# Patient Record
Sex: Male | Born: 1985 | Race: Black or African American | Hispanic: No | Marital: Single | Smoking: Current every day smoker
Health system: Southern US, Community
[De-identification: ages and names within clinical notes are randomized; demographics above are authoritative.]

## PROBLEM LIST (undated history)

## (undated) DIAGNOSIS — A549 Gonococcal infection, unspecified: Secondary | ICD-10-CM

---

## 2010-02-23 ENCOUNTER — Emergency Department (HOSPITAL_BASED_OUTPATIENT_CLINIC_OR_DEPARTMENT_OTHER): Admission: EM | Admit: 2010-02-23 | Discharge: 2010-02-23 | Payer: Self-pay | Admitting: Emergency Medicine

## 2010-02-23 ENCOUNTER — Emergency Department (HOSPITAL_BASED_OUTPATIENT_CLINIC_OR_DEPARTMENT_OTHER): Admission: EM | Admit: 2010-02-23 | Discharge: 2010-02-24 | Payer: Self-pay | Admitting: Emergency Medicine

## 2010-04-24 ENCOUNTER — Ambulatory Visit: Payer: Self-pay | Admitting: Diagnostic Radiology

## 2010-04-24 ENCOUNTER — Emergency Department (HOSPITAL_BASED_OUTPATIENT_CLINIC_OR_DEPARTMENT_OTHER): Admission: EM | Admit: 2010-04-24 | Discharge: 2010-04-25 | Payer: Self-pay | Admitting: Emergency Medicine

## 2011-10-01 IMAGING — CR DG FINGER MIDDLE 2+V*R*
3 series · 3 of 3 positions shown · non-contrast
Comparison: None.

CLINICAL DATA: Finger pain.  Blunt trauma.

RIGHT MIDDLE FINGER 2+V

[x finger pa right]
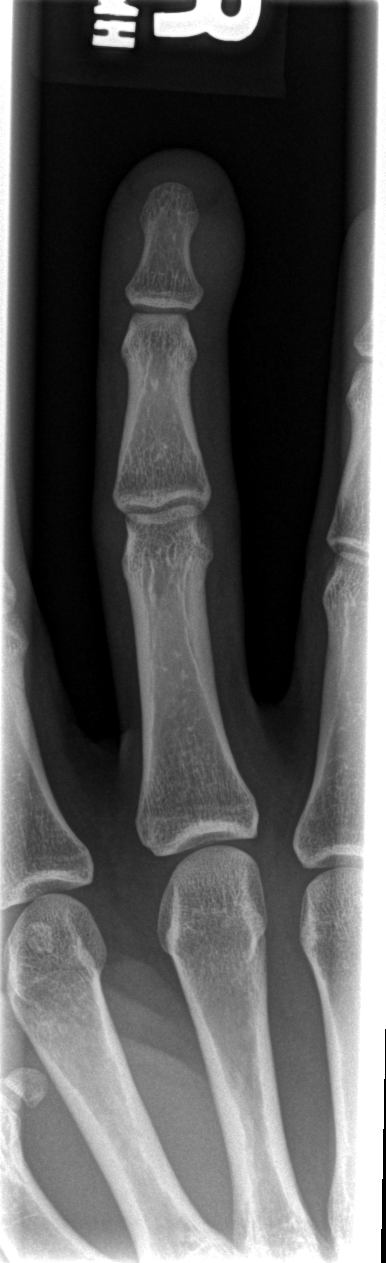

[x finger obl. right]
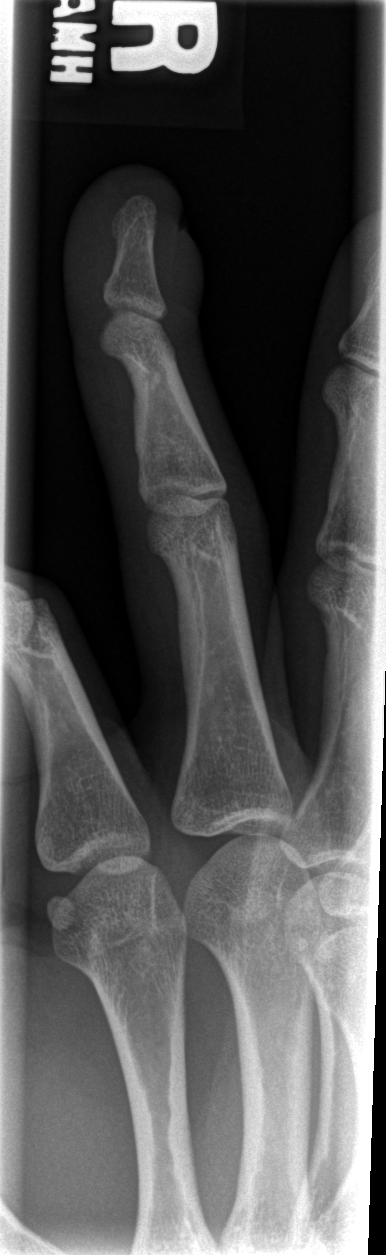

[x finger lateral right]
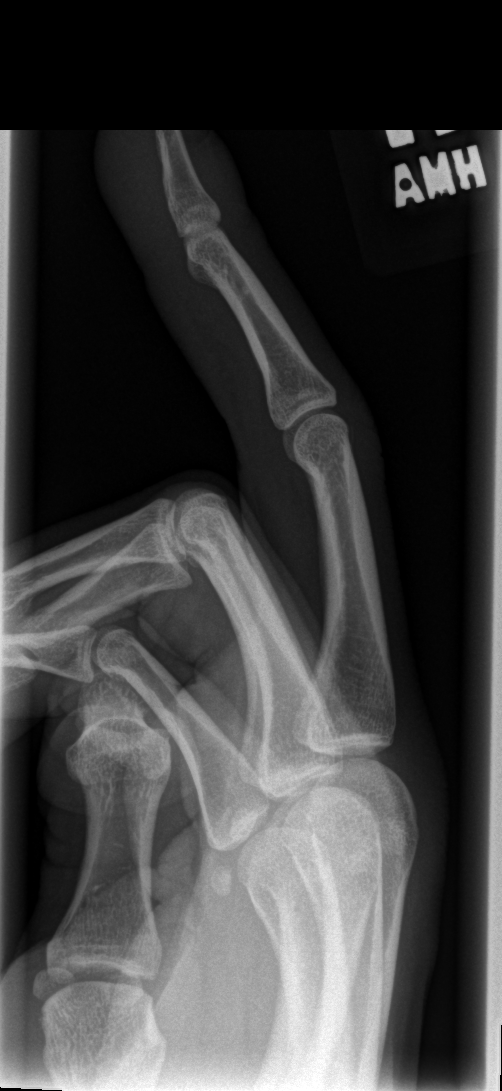

[3 of 3 positions shown; findings below may reference images not displayed]

FINDINGS: There is soft tissue swelling along the ulnar aspect of
the long finger terminal phalanx.  There is no fracture or
radiopaque foreign body.
IMPRESSION: Terminal phalanx right long finger swelling without osseous injury.

## 2011-11-21 ENCOUNTER — Emergency Department (HOSPITAL_BASED_OUTPATIENT_CLINIC_OR_DEPARTMENT_OTHER)
Admission: EM | Admit: 2011-11-21 | Discharge: 2011-11-21 | Disposition: A | Discharge status: Home / Self Care | Payer: Self-pay | Attending: Emergency Medicine | Admitting: Emergency Medicine

## 2011-11-21 ENCOUNTER — Encounter (HOSPITAL_BASED_OUTPATIENT_CLINIC_OR_DEPARTMENT_OTHER): Payer: Self-pay | Admitting: Emergency Medicine

## 2011-11-21 DIAGNOSIS — F17200 Nicotine dependence, unspecified, uncomplicated: Secondary | ICD-10-CM | POA: Insufficient documentation

## 2011-11-21 DIAGNOSIS — R300 Dysuria: Secondary | ICD-10-CM | POA: Insufficient documentation

## 2011-11-21 DIAGNOSIS — N342 Other urethritis: Secondary | ICD-10-CM

## 2011-11-21 MED ORDER — AZITHROMYCIN 1 G PO PACK
1.0000 g | PACK | Freq: Once | ORAL | Status: AC
  Administered 2011-11-21: 1 g via ORAL
  Filled 2011-11-21: qty 1

## 2011-11-21 MED ORDER — CEFTRIAXONE SODIUM 250 MG IJ SOLR
250.0000 mg | Freq: Once | INTRAMUSCULAR | Status: AC
  Administered 2011-11-21: 250 mg via INTRAMUSCULAR
  Filled 2011-11-21: qty 250

## 2011-11-21 NOTE — ED Notes (Signed)
Pt c/o pain with urination. Pt reports symptoms consistent with previous gonorrhea.

## 2011-11-21 NOTE — ED Provider Notes (Signed)
History     CSN: [id number]  Arrival date & time 11/21/11  0447   First MD Initiated Contact with Patient 11/21/11 [phone]      Chief Complaint  Patient presents with  . Dysuria    (Consider location/radiation/quality/duration/timing/severity/associated sxs/prior treatment) HPI This is a 26 year old black male who developed burning with urination this morning. He states the burning is severe. He also has some watery discharge. He states his symptoms are consistent with previous gonorrhea. He is having some mild abdominal discomfort. He denies nausea, vomiting, fever or chills.   History reviewed. No pertinent past medical history.  History reviewed. No pertinent past surgical history.  No family history on file.  History  Substance Use Topics  . Smoking status: Current Everyday Smoker  . Smokeless tobacco: Not on file  . Alcohol Use: No      Review of Systems  All other systems reviewed and are negative.    Allergies  Review of patient's allergies indicates no known allergies.  Home Medications  No current outpatient prescriptions on file.  BP 129/63  Pulse 87  Temp(Src) 98.7 F (37.1 C) (Oral)  Resp 18  SpO2 100%  Physical Exam General: Well-developed, well-nourished male in no acute distress; appearance consistent with age of record HENT: normocephalic, atraumatic Eyes: Normal appearance Neck: supple Heart: regular rate and rhythm Lungs: clear to auscultation bilaterally Abdomen: soft; nondistended; slight suprapubic tenderness GU: urethral discharge; circumcised Tanner 4 male Extremities: No deformity; full range of motion Neurologic: Awake, alert and oriented; motor function intact in all extremities and symmetric; no facial droop Skin: Warm and dry Psychiatric: Normal mood and affect    ED Course  Procedures (including critical care time)     MDM          Hanley Seamen, MD 11/21/11 2440

## 2011-11-21 NOTE — Discharge Instructions (Signed)
Urethritis, Adult  Urethritis is an inflammation (soreness) of the urethra (the tube exiting from the bladder). It is often caused by germs that may be spread through sexual contact.  TREATMENT   Urethritis will usually respond to antibiotics. These are medications that kill germs. Take all the medicine given to you. You may feel better in a couple days, but TAKE ALL MEDICINE or the infection may not be completely cured and may become more difficult to treat. Response can generally be expected in 7 to 10 days. You may require additional treatment after more testing.  HOME CARE INSTRUCTIONS   Not have sex until the test results are known and treatment is completed.   Know that you may be asked to notify your sex partner when your final test results are back.   Finish all medications as prescribed.   Prevent sexually transmitted infections including AIDS. Practice safe sex. Use condoms.  SEEK MEDICAL CARE IF:    Your symptoms are not improved in 2 to 3 days.   Your symptoms are getting worse.   Your develop abdominal pain.   You develop joint pain.  SEEK IMMEDIATE MEDICAL CARE IF:    You have a fever.   You develop severe pain in the belly, back or side.   You develop repeated vomiting.  TEST RESULTS  Not all test results are available during your visit. If your test results are not back during the visit, make an appointment with your caregiver to find out the results. Do not assume everything is normal if you have not heard from your caregiver or the medical facility. It is important for you to follow-up on all of your test results.  Document Released: 11/29/2000 Document Revised: 05/25/2011 Document Reviewed: 06/21/2009  [employer] Patient Information 2012 [employer].

## 2011-11-22 LAB — URINE CULTURE: Colony Count: NO GROWTH

## 2011-11-23 LAB — GC/CHLAMYDIA PROBE AMP, GENITAL: Chlamydia, DNA Probe: NEGATIVE

## 2011-11-24 NOTE — ED Notes (Signed)
+  Gonorrhea. Patient treated with Rocephin and Zithromax. [facility] faxed. 

## 2011-11-25 NOTE — ED Notes (Signed)
Notified patient of result and treatment.

## 2012-01-02 ENCOUNTER — Encounter (HOSPITAL_BASED_OUTPATIENT_CLINIC_OR_DEPARTMENT_OTHER): Payer: Self-pay | Admitting: *Deleted

## 2012-01-02 ENCOUNTER — Emergency Department (HOSPITAL_BASED_OUTPATIENT_CLINIC_OR_DEPARTMENT_OTHER)
Admission: EM | Admit: 2012-01-02 | Discharge: 2012-01-02 | Disposition: A | Discharge status: Home / Self Care | Payer: Self-pay | Attending: Emergency Medicine | Admitting: Emergency Medicine

## 2012-01-02 DIAGNOSIS — A549 Gonococcal infection, unspecified: Secondary | ICD-10-CM

## 2012-01-02 DIAGNOSIS — F17200 Nicotine dependence, unspecified, uncomplicated: Secondary | ICD-10-CM | POA: Insufficient documentation

## 2012-01-02 DIAGNOSIS — R369 Urethral discharge, unspecified: Secondary | ICD-10-CM | POA: Insufficient documentation

## 2012-01-02 HISTORY — DX: Gonococcal infection, unspecified: A54.9

## 2012-01-02 MED ORDER — AZITHROMYCIN 1 G PO PACK
1.0000 g | PACK | Freq: Once | ORAL | Status: AC
  Administered 2012-01-02: 1 g via ORAL
  Filled 2012-01-02: qty 1

## 2012-01-02 MED ORDER — CEFTRIAXONE SODIUM 250 MG IJ SOLR
250.0000 mg | Freq: Once | INTRAMUSCULAR | Status: AC
  Administered 2012-01-02: 250 mg via INTRAMUSCULAR
  Filled 2012-01-02: qty 250

## 2012-01-02 NOTE — ED Provider Notes (Signed)
History     CSN: [id number]  Arrival date & time 01/02/12  0131   First MD Initiated Contact with Patient 01/02/12 0256      Chief Complaint  Patient presents with  . Penile Discharge    (Consider location/radiation/quality/duration/timing/severity/associated sxs/prior treatment) HPI This is a 26 year old black male who was treated for gonorrhea a month ago by myself. Bullet lies to have his sexual partners in their sexual partners treated, he is not sure if this took place. He returns with the same symptoms, namely a penile discharge and burning with urination. The symptoms started about 3 days ago and have worsened. The symptoms are moderate. He also has some mild suprapubic pain. He denies testicular pain or swelling.  Past Medical History  Diagnosis Date  . Gonorrhea     History reviewed. No pertinent past surgical history.  No family history on file.  History  Substance Use Topics  . Smoking status: Current Everyday Smoker  . Smokeless tobacco: Never Used  . Alcohol Use: Not on file      Review of Systems  All other systems reviewed and are negative.    Allergies  Review of patient's allergies indicates no known allergies.  Home Medications  No current outpatient prescriptions on file.  BP 124/74  Pulse 59  Temp 97.9 F (36.6 C) (Oral)  Resp 18  SpO2 100%  Physical Exam General: Well-developed, well-nourished male in no acute distress; appearance consistent with age of record HENT: normocephalic, atraumatic Eyes: Normal per Neck: supple Heart: regular rate and rhythm Lungs: clear to auscultation bilaterally Abdomen: soft; nondistended; mild suprapubic tenderness GU: Tanner 4 male, circumcised; urethral discharge, yellow Extremities: No deformity; full range of motion Neurologic: Awake, alert and oriented; motor function intact in all extremities and symmetric; no facial droop Skin: Warm and dry Psychiatric: Normal mood and affect    ED Course   Procedures (including critical care time)     MDM  Patient was again advised to have his sexual partners treated and found him to have air sexual partners treated. He was advised that there is increasing antibiotic resistance to gonorrhea [place] and he should be using condoms and insuring that his partners are free of disease.        Hanley Seamen, MD 01/02/12 [phone]

## 2012-01-02 NOTE — ED Notes (Signed)
Pt reports recent tx for STD- states sx have returned- unsure if partners were treated

## 2012-01-02 NOTE — ED Notes (Signed)
MD at bedside. 

## 2012-01-03 LAB — GC/CHLAMYDIA PROBE AMP, GENITAL: Chlamydia, DNA Probe: NEGATIVE

## 2012-01-04 NOTE — ED Notes (Signed)
+   Gonorrhea-[facility] letter faxed. Patient treated per protocol MD. Patient treated per protocol MD.

## 2012-01-06 NOTE — ED Notes (Signed)
Attempted to contact patient. No answer. 

## 2012-01-07 NOTE — ED Notes (Signed)
Attempted to contact patient. No answer. 

## 2012-01-08 NOTE — ED Notes (Signed)
Patient informed of positive results after id'd x 2 and informed of need to notify partner to be treated. 

## 2012-06-22 ENCOUNTER — Emergency Department (HOSPITAL_BASED_OUTPATIENT_CLINIC_OR_DEPARTMENT_OTHER)
Admission: EM | Admit: 2012-06-22 | Discharge: 2012-06-22 | Disposition: A | Discharge status: Home / Self Care | Payer: Self-pay | Attending: Emergency Medicine | Admitting: Emergency Medicine

## 2012-06-22 ENCOUNTER — Encounter (HOSPITAL_BASED_OUTPATIENT_CLINIC_OR_DEPARTMENT_OTHER): Payer: Self-pay | Admitting: *Deleted

## 2012-06-22 DIAGNOSIS — Z7251 High risk heterosexual behavior: Secondary | ICD-10-CM

## 2012-06-22 DIAGNOSIS — N4889 Other specified disorders of penis: Secondary | ICD-10-CM | POA: Insufficient documentation

## 2012-06-22 DIAGNOSIS — Z8619 Personal history of other infectious and parasitic diseases: Secondary | ICD-10-CM | POA: Insufficient documentation

## 2012-06-22 DIAGNOSIS — F17200 Nicotine dependence, unspecified, uncomplicated: Secondary | ICD-10-CM | POA: Insufficient documentation

## 2012-06-22 MED ORDER — CEFTRIAXONE SODIUM 250 MG IJ SOLR
250.0000 mg | Freq: Once | INTRAMUSCULAR | Status: AC
  Administered 2012-06-22: 250 mg via INTRAMUSCULAR
  Filled 2012-06-22: qty 250

## 2012-06-22 MED ORDER — AZITHROMYCIN 1 G PO PACK
1.0000 g | PACK | Freq: Once | ORAL | Status: AC
  Administered 2012-06-22: 1 g via ORAL
  Filled 2012-06-22: qty 1

## 2012-06-22 NOTE — ED Provider Notes (Signed)
History   This chart was scribed for Dione Booze, MD scribed by Magnus Sinning. The patient was seen in room MH05/MH05 at 21:48    CSN: [id number]  Arrival date & time 06/22/12  2126     Chief Complaint  Patient presents with  . Exposure to STD    (Consider location/radiation/quality/duration/timing/severity/associated sxs/prior treatment) HPI Donald Shaw is a 27 y.o. male who presents to the Emergency Department complaining of a mildly painful bump present on his penis that he states appeared this morning when he woke up.   The patient states he has had multiple unprotected sexual encounters and he was worried after reading on the Internet that this bump was a symptom of a STD.   He currently rates the pain about a 6-7 of 10 and he says the pain is mainly present to palpation. He denies dysuria, or any other urinary sxs. He states he smokes a pack of cigarettes a day and that he does not have a primary physician.   Past Medical History  Diagnosis Date  . Gonorrhea     History reviewed. No pertinent past surgical history.  History reviewed. No pertinent family history.  History  Substance Use Topics  . Smoking status: Current Every Day Smoker  . Smokeless tobacco: Never Used  . Alcohol Use: Not on file      Review of Systems  Genitourinary: Negative.  Negative for dysuria.  All other systems reviewed and are negative.    Allergies  Review of patient's allergies indicates no known allergies.  Home Medications  No current outpatient prescriptions on file.  BP 132/76  Pulse 70  Temp 98.6 F (37 C) (Oral)  Resp 16  Ht 5\' 5"  (1.651 m)  Wt 140 lb (63.504 kg)  BMI 23.30 kg/m2  SpO2 100%  Physical Exam  Nursing note and vitals reviewed. Constitutional: He is oriented to person, place, and time. He appears well-developed and well-nourished. No distress.  HENT:  Head: Normocephalic and atraumatic.  Eyes: Conjunctivae normal and EOM are normal.  Neck: Neck  supple. No tracheal deviation present.  Cardiovascular: Normal rate.   Pulmonary/Chest: Effort normal. No respiratory distress.  Abdominal: He exhibits no distension.  Genitourinary:       Circumsized penis. No urethral discharge. 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 No inguinal adenopathy.   Musculoskeletal: Normal range of motion.  Neurological: He is alert and oriented to person, place, and time. No sensory deficit.  Skin: Skin is dry.  Psychiatric: He has a normal mood and affect. His behavior is normal.    ED Course  Procedures (including critical care time) DIAGNOSTIC STUDIES: Oxygen Saturation is 100% on room air, normal by my interpretation.    COORDINATION OF CARE: 21:51: Physical exam performed.   1. Penile cyst   2. Unprotected sex       MDM  Inclusion cyst of the penis. No evidence of OCD on physical exam. Patient was offered the option of prophylactic treatment given that he has had unprotected sex and he has requested that this be done. Specimens have been sent for GC and Chlamydia as well as RPR and HIV. He is discharged with safe sex instructions.  I personally performed the services described in this documentation, which was scribed in my presence. The recorded information has been reviewed and is accurate.   Dione Booze, MD 06/22/12 2202

## 2012-06-22 NOTE — ED Notes (Signed)
Pt requesting std check std check

## 2012-06-23 LAB — HIV ANTIBODY (ROUTINE TESTING W REFLEX): HIV: NONREACTIVE

## 2012-06-25 LAB — GC/CHLAMYDIA PROBE AMP: GC Probe RNA: NEGATIVE

## 2012-06-26 NOTE — ED Notes (Signed)
+   Chlamydia Patient treated with Rocephin and Zithromax-[facility] letter faxed 

## 2012-06-27 ENCOUNTER — Telehealth (HOSPITAL_COMMUNITY): Payer: Self-pay | Admitting: Emergency Medicine

## 2012-06-28 NOTE — ED Notes (Signed)
Patient informed of positive results after id'd x 2 and informed of need to notify partner to be treated. 

## 2012-10-14 ENCOUNTER — Emergency Department (HOSPITAL_BASED_OUTPATIENT_CLINIC_OR_DEPARTMENT_OTHER)
Admission: EM | Admit: 2012-10-14 | Discharge: 2012-10-14 | Disposition: A | Discharge status: Home / Self Care | Payer: Self-pay | Attending: Emergency Medicine | Admitting: Emergency Medicine

## 2012-10-14 ENCOUNTER — Encounter (HOSPITAL_BASED_OUTPATIENT_CLINIC_OR_DEPARTMENT_OTHER): Payer: Self-pay | Admitting: *Deleted

## 2012-10-14 DIAGNOSIS — Z8619 Personal history of other infectious and parasitic diseases: Secondary | ICD-10-CM | POA: Insufficient documentation

## 2012-10-14 DIAGNOSIS — F17200 Nicotine dependence, unspecified, uncomplicated: Secondary | ICD-10-CM | POA: Insufficient documentation

## 2012-10-14 DIAGNOSIS — N342 Other urethritis: Secondary | ICD-10-CM | POA: Insufficient documentation

## 2012-10-14 MED ORDER — AZITHROMYCIN 1 G PO PACK
1.0000 g | PACK | Freq: Once | ORAL | Status: AC
  Administered 2012-10-14: 1 g via ORAL
  Filled 2012-10-14: qty 1

## 2012-10-14 MED ORDER — CEFTRIAXONE SODIUM 250 MG IJ SOLR
250.0000 mg | Freq: Once | INTRAMUSCULAR | Status: AC
  Administered 2012-10-14: 250 mg via INTRAMUSCULAR
  Filled 2012-10-14: qty 250

## 2012-10-14 MED ORDER — LIDOCAINE HCL (PF) 1 % IJ SOLN
INTRAMUSCULAR | Status: AC
  Administered 2012-10-14: 1.5 mL
  Filled 2012-10-14: qty 5

## 2012-10-14 MED ORDER — METRONIDAZOLE 500 MG PO TABS
2000.0000 mg | ORAL_TABLET | Freq: Once | ORAL | Status: AC
  Administered 2012-10-14: 2000 mg via ORAL
  Filled 2012-10-14: qty 4

## 2012-10-14 NOTE — ED Provider Notes (Signed)
History    This chart was scribed for Chelsei Mcchesney Smitty Cords, MD by Donne Anon, ED Scribe. This patient was seen in room MH02/MH02 and the patient's care was started at 0051.   CSN: [id number]  Arrival date & time 10/14/12  0038   First MD Initiated Contact with Patient 10/14/12 0051      Chief Complaint  Patient presents with  . Exposure to STD    (Consider location/radiation/quality/duration/timing/severity/associated sxs/prior treatment) Patient is a 27 y.o. male presenting with STD exposure. The history is provided by the patient. No language interpreter was used.  Exposure to STD This is a new problem. The current episode started 1 to 2 hours ago. The problem has not changed since onset.Pertinent negatives include no chest pain and no abdominal pain. Nothing aggravates the symptoms. Nothing relieves the symptoms. He has tried nothing for the symptoms.   Donald Shaw is a 27 y.o. male who presents to the Emergency Department complaining of possible exposure to a STI. He states his ex-girlfriend cheated on him and he wants to make sure he does not have a STI. He reports mild penile discharge which began a few hours PTA while he was watching TV but denies dysuria. Past Medical History  Diagnosis Date  . Gonorrhea     History reviewed. No pertinent past surgical history.  History reviewed. No pertinent family history.  History  Substance Use Topics  . Smoking status: Current Every Day Smoker  . Smokeless tobacco: Never Used  . Alcohol Use: Not on file      Review of Systems  Cardiovascular: Negative for chest pain.  Gastrointestinal: Negative for abdominal pain.  Genitourinary: Positive for discharge. Negative for dysuria.  All other systems reviewed and are negative.    Allergies  Review of patient's allergies indicates no known allergies.  Home Medications  No current outpatient prescriptions on file.  BP 137/87  Pulse 75  Temp(Src) 98.3 F (36.8 C)  (Oral)  Resp 18  Ht 5\' 3"  (1.6 m)  Wt 140 lb (63.504 kg)  BMI 24.81 kg/m2  SpO2 95%  Physical Exam  Nursing note and vitals reviewed. Constitutional: He is oriented to person, place, and time. He appears well-developed and well-nourished. No distress.  HENT:  Head: Normocephalic and atraumatic.  Mouth/Throat: Oropharynx is clear and moist. No oropharyngeal exudate.  Eyes: Conjunctivae and EOM are normal. Pupils are equal, round, and reactive to light.  Neck: Neck supple. No tracheal deviation present.  Cardiovascular: Normal rate, regular rhythm and normal heart sounds.   Pulmonary/Chest: Effort normal and breath sounds normal. No respiratory distress.  Abdominal: Soft. Bowel sounds are normal. He exhibits no distension. There is no tenderness. There is no rebound and no guarding.  Genitourinary:  Chaperone present, yellowish discharge  Musculoskeletal: Normal range of motion.  Neurological: He is alert and oriented to person, place, and time.  Skin: Skin is warm and dry.  Psychiatric: He has a normal mood and affect. His behavior is normal.    ED Course  Procedures (including critical care time) DIAGNOSTIC STUDIES: Oxygen Saturation is 95% on room air, low by my interpretation.    COORDINATION OF CARE: 12:54 AM Discussed treatment plan which includes medication with pt at bedside and pt agreed to plan.  Medications  cefTRIAXone (ROCEPHIN) injection 250 mg (not administered)  azithromycin (ZITHROMAX) powder 1 g (not administered)  metroNIDAZOLE (FLAGYL) tablet 2,000 mg (not administered)    \ Labs Reviewed - No data to display No results found.  No diagnosis found.    MDM   Pt informed of no sexual activity of any kind even with a condom until 7 days after all sexual partners have been treated. Will treat for all STIs. I personally performed the services described in this documentation, which was scribed in my presence. The recorded information has been reviewed  and is accurate.          Donald Awe, MD 10/14/12 [phone]

## 2012-10-14 NOTE — ED Notes (Signed)
Pt states his ex-girlfriend cheated on him and he wants to make sure he does not have an STD. Denies symptoms.

## 2012-10-14 NOTE — ED Notes (Signed)
Pt report penial discharge denies pain with urination or other pain

## 2012-10-16 NOTE — ED Notes (Signed)
+   Gonorrhea Patient treated with Rocephin and Zithromax. [facility] letter faxed

## 2012-10-16 NOTE — ED Notes (Signed)
LVM message for patient to return call @ [phone]

## 2012-10-19 ENCOUNTER — Telehealth (HOSPITAL_COMMUNITY): Payer: Self-pay | Admitting: Emergency Medicine

## 2012-10-20 ENCOUNTER — Telehealth (HOSPITAL_COMMUNITY): Payer: Self-pay | Admitting: Emergency Medicine

## 2016-01-28 ENCOUNTER — Encounter (HOSPITAL_BASED_OUTPATIENT_CLINIC_OR_DEPARTMENT_OTHER): Payer: Self-pay | Admitting: *Deleted

## 2016-01-28 ENCOUNTER — Emergency Department (HOSPITAL_BASED_OUTPATIENT_CLINIC_OR_DEPARTMENT_OTHER)
Admission: EM | Admit: 2016-01-28 | Discharge: 2016-01-28 | Disposition: A | Discharge status: Home / Self Care | Payer: Self-pay | Attending: Emergency Medicine | Admitting: Emergency Medicine

## 2016-01-28 DIAGNOSIS — F17200 Nicotine dependence, unspecified, uncomplicated: Secondary | ICD-10-CM | POA: Insufficient documentation

## 2016-01-28 DIAGNOSIS — N390 Urinary tract infection, site not specified: Secondary | ICD-10-CM | POA: Insufficient documentation

## 2016-01-28 LAB — URINALYSIS, ROUTINE W REFLEX MICROSCOPIC
Bilirubin Urine: NEGATIVE
GLUCOSE, UA: NEGATIVE mg/dL
Ketones, ur: NEGATIVE mg/dL
Nitrite: NEGATIVE
PH: 6 (ref 5.0–8.0)
PROTEIN: NEGATIVE mg/dL
SPECIFIC GRAVITY, URINE: 1.019 (ref 1.005–1.030)

## 2016-01-28 LAB — BASIC METABOLIC PANEL
ANION GAP: 8 (ref 5–15)
BUN: 9 mg/dL (ref 6–20)
CALCIUM: 9.1 mg/dL (ref 8.9–10.3)
CO2: 23 mmol/L (ref 22–32)
Chloride: 103 mmol/L (ref 101–111)
Creatinine, Ser: 0.82 mg/dL (ref 0.61–1.24)
GFR calc Af Amer: >60 mL/min (ref >60)
GFR calc non Af Amer: >60 mL/min (ref >60)
GLUCOSE: 92 mg/dL (ref 65–99)
Potassium: 3.5 mmol/L (ref 3.5–5.1)
Sodium: 134 mmol/L — ABNORMAL LOW (ref 135–145)

## 2016-01-28 LAB — CBC WITH DIFFERENTIAL/PLATELET
BASOS ABS: 0 10*3/uL (ref 0.0–0.1)
Basophils Relative: 0 %
EOS PCT: 2 %
Eosinophils Absolute: 0.1 10*3/uL (ref 0.0–0.7)
HEMATOCRIT: 41.9 % (ref 39.0–52.0)
Hemoglobin: 14.4 g/dL (ref 13.0–17.0)
LYMPHS ABS: 3.3 10*3/uL (ref 0.7–4.0)
LYMPHS PCT: 53 %
MCH: 29.7 pg (ref 26.0–34.0)
MCHC: 34.4 g/dL (ref 30.0–36.0)
MCV: 86.4 fL (ref 78.0–100.0)
MONO ABS: 0.3 10*3/uL (ref 0.1–1.0)
MONOS PCT: 5 %
NEUTROS ABS: 2.5 10*3/uL (ref 1.7–7.7)
Neutrophils Relative %: 40 %
Platelets: 230 10*3/uL (ref 150–400)
RBC: 4.85 MIL/uL (ref 4.22–5.81)
RDW: 13.1 % (ref 11.5–15.5)
WBC: 6.3 10*3/uL (ref 4.0–10.5)

## 2016-01-28 LAB — URINE MICROSCOPIC-ADD ON

## 2016-01-28 MED ORDER — CEFTRIAXONE SODIUM 250 MG IJ SOLR
250.0000 mg | Freq: Once | INTRAMUSCULAR | Status: AC
  Administered 2016-01-28: 250 mg via INTRAMUSCULAR
  Filled 2016-01-28: qty 250

## 2016-01-28 MED ORDER — CEPHALEXIN 500 MG PO CAPS: 500.0000 mg | ORAL_CAPSULE | Freq: Two times a day (BID) | ORAL | 0 refills | Status: DC

## 2016-01-28 MED ORDER — AZITHROMYCIN 250 MG PO TABS
1000.0000 mg | ORAL_TABLET | Freq: Once | ORAL | Status: AC
  Administered 2016-01-28: 1000 mg via ORAL
  Filled 2016-01-28: qty 4

## 2016-01-28 NOTE — ED Provider Notes (Signed)
[facility]   CSN: [id number] Arrival date & time: 01/28/16  0137  First Provider Contact:  None       History   Chief Complaint Chief Complaint  Patient presents with  . Abdominal Pain    HPI Donald Shaw is a 30 y.o. male.  HPI 30 year old male who presents with abdominal pain and difficulty urinating. Two days ago with urinary frequency, hesitancy, and incomplete voiding. States urine comes in short "squirts" and it takes him a long time to empty the bladder.  No significant dysuria. Onset of abdominal pain and vomiting last night. Pain in low abdomen, sharp and stabbing, comes and goes. Vomiting x 3 over past 24 hours. No diarrhea, constipation, fever or chills. No abnormal penile discharge.   Past Medical History:  Diagnosis Date  . Gonorrhea     There are no active problems to display for this patient.   History reviewed. No pertinent surgical history.     Home Medications    Prior to Admission medications   Medication Sig Start Date End Date Taking? Authorizing Provider  cephALEXin (KEFLEX) 500 MG capsule Take 1 capsule (500 mg total) by mouth 2 (two) times daily. 01/28/16   Lavera Guiseana Duo Liu, MD    Family History No family history on file.  Social History Social History  Substance Use Topics  . Smoking status: Current Every Day Smoker  . Smokeless tobacco: Never Used  . Alcohol use 1.5 oz/week    3 Standard drinks or equivalent per week     Allergies   Review of patient's allergies indicates no known allergies.   Review of Systems Review of Systems 10/14 systems reviewed and are negative other than those stated in the HPI   Physical Exam Updated Vital Signs BP 118/77 (BP Location: Right Arm)   Pulse 67   Temp 98.2 F (36.8 C) (Oral)   Resp 18   Ht 5\' 4"  (1.626 m)   Wt 145 lb (65.8 kg)   SpO2 98%   BMI 24.89 kg/m   Physical Exam Physical Exam  Nursing note and vitals reviewed. Constitutional: Well  developed, well nourished, non-toxic, and in no acute distress Head: Normocephalic and atraumatic.  Mouth/Throat: Oropharynx is clear and moist.  Neck: Normal range of motion. Neck supple.  Cardiovascular: Normal rate and regular rhythm.   Pulmonary/Chest: Effort normal and breath sounds normal.  Abdominal: Soft. There is suprapubic tenderness. There is no rebound and no guarding. No CVA tenderness. GU: normal external genitalia. Normal testicular lie and scrotum. No swelling or mass. No lesions or discharge Musculoskeletal: Normal range of motion.  Neurological: Alert, no facial droop, fluent speech, moves all extremities symmetrically Skin: Skin is warm and dry.  Psychiatric: Cooperative   ED Treatments / Results  Labs (all labs ordered are listed, but only abnormal results are displayed) Labs Reviewed  URINALYSIS, ROUTINE W REFLEX MICROSCOPIC (NOT AT [facility]) - Abnormal; Notable for the following:       Result Value   APPearance CLOUDY (*)    Hgb urine dipstick SMALL (*)    Leukocytes, UA LARGE (*)    All other components within normal limits  BASIC METABOLIC PANEL - Abnormal; Notable for the following:    Sodium 134 (*)    All other components within normal limits  URINE MICROSCOPIC-ADD ON - Abnormal; Notable for the following:    Squamous Epithelial / LPF 0-5 (*)    Bacteria, UA FEW (*)    All  other components within normal limits  URINE CULTURE  CBC WITH DIFFERENTIAL/PLATELET  GC/CHLAMYDIA PROBE AMP ([place]) NOT AT [facility]    EKG  EKG Interpretation None       Radiology No results found.  Procedures Procedures (including critical care time)  Medications Ordered in ED Medications  cefTRIAXone (ROCEPHIN) injection 250 mg (not administered)  azithromycin (ZITHROMAX) tablet 1,000 mg (not administered)     Initial Impression / Assessment and Plan / ED Course  I have reviewed the triage vital signs and the nursing notes.  Pertinent labs & imaging results that  were available during my care of the patient were reviewed by me and considered in my medical decision making (see chart for details).  Clinical Course   UA suggestive of UTI w/ moderate leukocytes, bacteria and WBCs. Abdomen soft and benign w/ suprapubic discomfort. Normal GU exam. No CVA tenderness. Vital signs normal. No retention of urine on bladder scan and normal renal function/blood work. Treated for STDs per request and GC/chalmydia screen ordered. Will treat with course of keflex as well. Strict return and follow-up instructions reviewed. He expressed understanding of all discharge instructions and felt comfortable with the plan of care.   Final Clinical Impressions(s) / ED Diagnoses   Final diagnoses:  UTI (lower urinary tract infection)    New Prescriptions New Prescriptions   CEPHALEXIN (KEFLEX) 500 MG CAPSULE    Take 1 capsule (500 mg total) by mouth 2 (two) times daily.     Lavera Guise, MD 01/28/16 [phone]

## 2016-01-28 NOTE — Discharge Instructions (Signed)
Take antibiotics for your urinary tract infection. You were also treated for potential STDs.   Return for worsening symptoms, including fever, severe back pain, worsening abdominal pain, intractable vomiting or any other symptoms concerning to you.

## 2016-01-28 NOTE — ED Triage Notes (Signed)
States diff urinating x 2 days  Increased freq,  Small amounts at a time denies penile pain denies dc abd pain onset last pm, vomited x 3 today

## 2016-01-29 LAB — URINE CULTURE: Culture: NO GROWTH

## 2016-01-31 LAB — GC/CHLAMYDIA PROBE AMP (~~LOC~~) NOT AT ARMC
CHLAMYDIA, DNA PROBE: NEGATIVE
Neisseria Gonorrhea: POSITIVE — AB

## 2016-02-01 ENCOUNTER — Telehealth: Payer: Self-pay | Admitting: *Deleted

## 2016-02-25 ENCOUNTER — Encounter (HOSPITAL_BASED_OUTPATIENT_CLINIC_OR_DEPARTMENT_OTHER): Payer: Self-pay | Admitting: Emergency Medicine

## 2016-02-25 ENCOUNTER — Emergency Department (HOSPITAL_BASED_OUTPATIENT_CLINIC_OR_DEPARTMENT_OTHER)
Admission: EM | Admit: 2016-02-25 | Discharge: 2016-02-25 | Disposition: A | Discharge status: Home / Self Care | Payer: Self-pay | Attending: Emergency Medicine | Admitting: Emergency Medicine

## 2016-02-25 DIAGNOSIS — Z711 Person with feared health complaint in whom no diagnosis is made: Secondary | ICD-10-CM

## 2016-02-25 DIAGNOSIS — F17200 Nicotine dependence, unspecified, uncomplicated: Secondary | ICD-10-CM | POA: Insufficient documentation

## 2016-02-25 DIAGNOSIS — Z202 Contact with and (suspected) exposure to infections with a predominantly sexual mode of transmission: Secondary | ICD-10-CM | POA: Insufficient documentation

## 2016-02-25 LAB — URINALYSIS, ROUTINE W REFLEX MICROSCOPIC
BILIRUBIN URINE: NEGATIVE
Glucose, UA: NEGATIVE mg/dL
Hgb urine dipstick: NEGATIVE
Ketones, ur: 15 mg/dL — AB
NITRITE: NEGATIVE
Protein, ur: NEGATIVE mg/dL
SPECIFIC GRAVITY, URINE: 1.028 (ref 1.005–1.030)
pH: 6 (ref 5.0–8.0)

## 2016-02-25 LAB — URINE MICROSCOPIC-ADD ON

## 2016-02-25 MED ORDER — AZITHROMYCIN 250 MG PO TABS
1000.0000 mg | ORAL_TABLET | Freq: Once | ORAL | Status: AC
  Administered 2016-02-25: 1000 mg via ORAL
  Filled 2016-02-25: qty 4

## 2016-02-25 MED ORDER — CEFTRIAXONE SODIUM 250 MG IJ SOLR
250.0000 mg | Freq: Once | INTRAMUSCULAR | Status: AC
  Administered 2016-02-25: 250 mg via INTRAMUSCULAR
  Filled 2016-02-25: qty 250

## 2016-02-25 MED ORDER — CEPHALEXIN 500 MG PO CAPS: 500.0000 mg | ORAL_CAPSULE | Freq: Three times a day (TID) | ORAL | 0 refills | Status: AC

## 2016-02-25 MED FILL — CEPHALEXIN 500 MG CAPSULE: 500 | 7 days supply | Qty: 21 | Fill #0

## 2016-02-25 NOTE — ED Triage Notes (Signed)
Pt was seen here previously and dx with gonorrhea and lost his abx. Pt states he is continuing to have penile discharge.

## 2016-02-25 NOTE — ED Provider Notes (Signed)
[facility]   CSN: [id number] Arrival date & time: 02/25/16  0138     History   Chief Complaint Chief Complaint  Patient presents with  . SEXUALLY TRANSMITTED DISEASE    HPI Donald Shaw is a 30 y.o. male.  HPI  This is a 30 year old male with recent history of gonorrhea who presents with penile discharge. Patient reports one-day history of penile discharge. He states that he was seen one month ago and treated. However, he did not finish antibiotics. He states that he thought "it got better" but noted symptoms yesterday. Denies dysuria, flank pain, fevers. Reports that he has not had any sexual encounter since his last evaluation.  On chart review, patient was treated with Rocephin and azithromycin while in the emergency department. He was discharged on Keflex with concerns for concomitant UTI. He would have been appropriately treated for gonorrhea.  Past Medical History:  Diagnosis Date  . Gonorrhea     There are no active problems to display for this patient.   History reviewed. No pertinent surgical history.     Home Medications    Prior to Admission medications   Medication Sig Start Date End Date Taking? Authorizing Provider  cephALEXin (KEFLEX) 500 MG capsule Take 1 capsule (500 mg total) by mouth 3 (three) times daily. 02/25/16   Shon Baton, MD    Family History No family history on file.  Social History Social History  Substance Use Topics  . Smoking status: Current Every Day Smoker  . Smokeless tobacco: Never Used  . Alcohol use 1.5 oz/week    3 Standard drinks or equivalent per week     Allergies   Review of patient's allergies indicates no known allergies.   Review of Systems Review of Systems  Constitutional: Negative for fever.  Genitourinary: Positive for discharge. Negative for difficulty urinating, dysuria and flank pain.  All other systems reviewed and are negative.    Physical Exam Updated Vital  Signs BP 124/77   Pulse 77   Temp 98 F (36.7 C) (Oral)   Resp 16   Ht 5\' 4"  (1.626 m)   Wt 145 lb (65.8 kg)   SpO2 97%   BMI 24.89 kg/m   Physical Exam  Constitutional: He is oriented to person, place, and time. He appears well-developed and well-nourished. No distress.  Smells of marijuana  HENT:  Head: Normocephalic and atraumatic.  Cardiovascular: Normal rate and regular rhythm.   Pulmonary/Chest: Effort normal. No respiratory distress.  Genitourinary: Penis normal.  Genitourinary Comments: Normal circumcised penis, normal testicular lie with no masses noted, no discharge noted  Musculoskeletal: He exhibits no edema.  Neurological: He is alert and oriented to person, place, and time.  Skin: Skin is warm and dry.  Psychiatric: He has a normal mood and affect.  Nursing note and vitals reviewed.    ED Treatments / Results  Labs (all labs ordered are listed, but only abnormal results are displayed) Labs Reviewed  URINALYSIS, ROUTINE W REFLEX MICROSCOPIC (NOT AT [facility]) - Abnormal; Notable for the following:       Result Value   APPearance CLOUDY (*)    Ketones, ur 15 (*)    Leukocytes, UA MODERATE (*)    All other components within normal limits  URINE MICROSCOPIC-ADD ON - Abnormal; Notable for the following:    Squamous Epithelial / LPF 0-5 (*)    Bacteria, UA FEW (*)    All other components within normal limits  GC/CHLAMYDIA PROBE  AMP ([place]) NOT AT [facility]    EKG  EKG Interpretation None       Radiology No results found.  Procedures Procedures (including critical care time)  Medications Ordered in ED Medications  cefTRIAXone (ROCEPHIN) injection 250 mg (250 mg Intramuscular Given 02/25/16 0219)  azithromycin (ZITHROMAX) tablet 1,000 mg (1,000 mg Oral Given 02/25/16 0219)     Initial Impression / Assessment and Plan / ED Course  I have reviewed the triage vital signs and the nursing notes.  Pertinent labs & imaging results that were available during  my care of the patient were reviewed by me and considered in my medical decision making (see chart for details).  Clinical Course   Patient presents with penile discharge. Recent history of STDs. Nontoxic on exam. GU exam is unremarkable. Urinalysis shows too numerous to count white cells. GC/Chlamydia swab pending. Suspect recurrent STD. Less likely UTI. However I will culture and treat the patient empirically. I do not feel the patient is fully forthcoming with his recent sexual encounters. I discussed with the importance of always using condoms and abstaining for the next 10 days. Patient stated understanding.  After history, exam, and medical workup I feel the patient has been appropriately medically screened and is safe for discharge home. Pertinent diagnoses were discussed with the patient. Patient was given return precautions.   Final Clinical Impressions(s) / ED Diagnoses   Final diagnoses:  Concern about STD in male without diagnosis    New Prescriptions Current Discharge Medication List       Shon Batonourtney F Saoirse Legere, MD 02/25/16 [phone]

## 2016-02-25 NOTE — Discharge Instructions (Signed)
You were seen today for concern for STDs. You were treated. You need to abstain from sexual activity for 10 days. You need to always use condoms for protection. You will be given an antibiotic to also cover for urinary tract infection.

## 2016-02-25 NOTE — ED Notes (Signed)
Pt verbalizes understanding of d/c instructions and denies any further needs at this time. 

## 2016-02-26 LAB — URINE CULTURE: Culture: NO GROWTH

## 2016-02-28 LAB — GC/CHLAMYDIA PROBE AMP (~~LOC~~) NOT AT ARMC
Chlamydia: NEGATIVE
Neisseria Gonorrhea: POSITIVE — AB

## 2016-02-29 ENCOUNTER — Telehealth (HOSPITAL_COMMUNITY): Payer: Self-pay

## 2016-02-29 NOTE — Telephone Encounter (Signed)
Results received from [place].  (+) Gonorrhea.  Treated with Zithromax and Rocephin.  Pt ID verified x 2.  Pt informed of dx, tx rcvd appropriate, notify partner & abstain from sex x 2 weeks.  [facility] form completed and faxed.    

## 2016-04-05 ENCOUNTER — Telehealth (HOSPITAL_BASED_OUTPATIENT_CLINIC_OR_DEPARTMENT_OTHER): Payer: Self-pay | Admitting: Emergency Medicine

## 2016-04-05 NOTE — Telephone Encounter (Signed)
Lost to followup 

## 2019-09-30 ENCOUNTER — Emergency Department (HOSPITAL_BASED_OUTPATIENT_CLINIC_OR_DEPARTMENT_OTHER)
Admission: EM | Admit: 2019-09-30 | Discharge: 2019-09-30 | Disposition: A | Discharge status: Home / Self Care | Payer: Self-pay | Attending: Emergency Medicine | Admitting: Emergency Medicine

## 2019-09-30 ENCOUNTER — Other Ambulatory Visit: Payer: Self-pay

## 2019-09-30 ENCOUNTER — Encounter (HOSPITAL_BASED_OUTPATIENT_CLINIC_OR_DEPARTMENT_OTHER): Payer: Self-pay

## 2019-09-30 DIAGNOSIS — R1033 Periumbilical pain: Secondary | ICD-10-CM | POA: Insufficient documentation

## 2019-09-30 DIAGNOSIS — F17210 Nicotine dependence, cigarettes, uncomplicated: Secondary | ICD-10-CM | POA: Insufficient documentation

## 2019-09-30 LAB — CBC
HCT: 46.1 % (ref 39.0–52.0)
Hemoglobin: 14.9 g/dL (ref 13.0–17.0)
MCH: 28.6 pg (ref 26.0–34.0)
MCHC: 32.3 g/dL (ref 30.0–36.0)
MCV: 88.5 fL (ref 80.0–100.0)
Platelets: 281 10*3/uL (ref 150–400)
RBC: 5.21 MIL/uL (ref 4.22–5.81)
RDW: 13.1 % (ref 11.5–15.5)
WBC: 4 10*3/uL (ref 4.0–10.5)
nRBC: 0 % (ref 0.0–0.2)

## 2019-09-30 LAB — COMPREHENSIVE METABOLIC PANEL
ALT: 22 U/L (ref 0–44)
AST: 26 U/L (ref 15–41)
Albumin: 4.5 g/dL (ref 3.5–5.0)
Alkaline Phosphatase: 54 U/L (ref 38–126)
Anion gap: 8 (ref 5–15)
BUN: 10 mg/dL (ref 6–20)
CO2: 26 mmol/L (ref 22–32)
Calcium: 9.4 mg/dL (ref 8.9–10.3)
Chloride: 105 mmol/L (ref 98–111)
Creatinine, Ser: 0.96 mg/dL (ref 0.61–1.24)
GFR calc Af Amer: >60 mL/min (ref >60)
GFR calc non Af Amer: >60 mL/min (ref >60)
Glucose, Bld: 96 mg/dL (ref 70–99)
Potassium: 4.2 mmol/L (ref 3.5–5.1)
Sodium: 139 mmol/L (ref 135–145)
Total Bilirubin: 0.6 mg/dL (ref 0.3–1.2)
Total Protein: 8.3 g/dL — ABNORMAL HIGH (ref 6.5–8.1)

## 2019-09-30 LAB — URINALYSIS, ROUTINE W REFLEX MICROSCOPIC
Bilirubin Urine: NEGATIVE
Glucose, UA: NEGATIVE mg/dL
Hgb urine dipstick: NEGATIVE
Ketones, ur: NEGATIVE mg/dL
Leukocytes,Ua: NEGATIVE
Nitrite: NEGATIVE
Protein, ur: NEGATIVE mg/dL
Specific Gravity, Urine: 1.02 (ref 1.005–1.030)
pH: 8.5 — ABNORMAL HIGH (ref 5.0–8.0)

## 2019-09-30 LAB — LIPASE, BLOOD: Lipase: 38 U/L (ref 11–51)

## 2019-09-30 MED ORDER — SODIUM CHLORIDE 0.9% FLUSH
3.0000 mL | Freq: Once | INTRAVENOUS | Status: DC
  Filled 2019-09-30: qty 3

## 2019-09-30 NOTE — ED Provider Notes (Signed)
[place] EMERGENCY DEPARTMENT Provider Note   CSN: [id number] Arrival date & time: 09/30/19  1152     History Chief Complaint  Patient presents with  . Abdominal Pain    Donald Shaw is a 34 y.o. male.  The history is provided by the patient.  Abdominal Pain Pain location:  Periumbilical Pain quality comment:  Uncomfortable feeling Pain radiates to:  Does not radiate Pain severity:  Mild Onset quality:  Gradual Duration:  8 hours Timing:  Intermittent Progression:  Improving Chronicity:  New Context: not medication withdrawal, not previous surgeries, not recent illness, not sick contacts and not suspicious food intake   Context comment:  Patient reports that he does drink regularly and drink last night but did not drink in excess of his normal Relieved by:  None tried Worsened by:  Nothing Ineffective treatments:  None tried Associated symptoms: no anorexia, no chest pain, no cough, no diarrhea, no dysuria, no fever, no hematuria, no nausea, no shortness of breath and no vomiting   Risk factors: has not had multiple surgeries        Past Medical History:  Diagnosis Date  . Gonorrhea     There are no problems to display for this patient.   History reviewed. No pertinent surgical history.     No family history on file.  Social History   Tobacco Use  . Smoking status: Current Every Day Smoker    Types: Cigarettes  . Smokeless tobacco: Never Used  Substance Use Topics  . Alcohol use: Yes    Comment: occ  . Drug use: Yes    Types: Marijuana    Home Medications Prior to Admission medications   Medication Sig Start Date End Date Taking? Authorizing Provider  cephALEXin (KEFLEX) 500 MG capsule Take 1 capsule (500 mg total) by mouth 3 (three) times daily. 02/25/16   Horton, Barbette Hair, MD    Allergies    Patient has no known allergies.  Review of Systems   Review of Systems  Constitutional: Negative for fever.  Respiratory: Negative for  cough and shortness of breath.   Cardiovascular: Negative for chest pain.  Gastrointestinal: Positive for abdominal pain. Negative for anorexia, diarrhea, nausea and vomiting.  Genitourinary: Negative for dysuria and hematuria.  All other systems reviewed and are negative.   Physical Exam Updated Vital Signs BP (!) 131/93 (BP Location: Right Arm)   Pulse 68   Temp 98.1 F (36.7 C) (Oral)   Resp 18   Ht 5\' 4"  (5.027 m)   Wt 69.9 kg   SpO2 99%   BMI 26.43 kg/m   Physical Exam Vitals and nursing note reviewed.  Constitutional:      General: He is not in acute distress.    Appearance: He is well-developed and normal weight.  HENT:     Head: Normocephalic and atraumatic.  Eyes:     Conjunctiva/sclera: Conjunctivae normal.     Pupils: Pupils are equal, round, and reactive to light.  Cardiovascular:     Rate and Rhythm: Normal rate and regular rhythm.     Heart sounds: No murmur.  Pulmonary:     Effort: Pulmonary effort is normal. No respiratory distress.     Breath sounds: Normal breath sounds. No wheezing or rales.  Abdominal:     General: There is no distension.     Palpations: Abdomen is soft.     Tenderness: There is no abdominal tenderness. There is no right CVA tenderness, left CVA tenderness, guarding  or rebound.     Hernia: A hernia is present. Hernia is present in the umbilical area.  Musculoskeletal:        General: No tenderness. Normal range of motion.     Cervical back: Normal range of motion and neck supple.  Skin:    General: Skin is warm and dry.     Findings: No erythema or rash.  Neurological:     Mental Status: He is alert and oriented to person, place, and time.  Psychiatric:        Behavior: Behavior normal.     ED Results / Procedures / Treatments   Labs (all labs ordered are listed, but only abnormal results are displayed) Labs Reviewed  COMPREHENSIVE METABOLIC PANEL - Abnormal; Notable for the following components:      Result Value    Total Protein 8.3 (*)    All other components within normal limits  URINALYSIS, ROUTINE W REFLEX MICROSCOPIC - Abnormal; Notable for the following components:   pH 8.5 (*)    All other components within normal limits  LIPASE, BLOOD  CBC    EKG None  Radiology No results found.  Procedures Procedures (including critical care time)  Medications Ordered in ED Medications  sodium chloride flush (NS) 0.9 % injection 3 mL (has no administration in time range)    ED Course  I have reviewed the triage vital signs and the nursing notes.  Pertinent labs & imaging results that were available during my care of the patient were reviewed by me and considered in my medical decision making (see chart for details).    MDM Rules/Calculators/A&P                     34 year old male presenting today with a vague abdominal discomfort.  It was present when he woke up this morning.  Patient has no localized abdominal tenderness at this time.  He does have a small umbilical hernia which is reducible and nontender.  He has no findings concerning for appendicitis, diverticulitis, obstruction, pancreatitis or hepatitis.  Patient had labs prior to being evaluated which showed a normal lipase, CMP and CBC.  Patient's urine without acute findings.  He denies any urinary symptoms, testicular pain or swelling.  No penile rashes or other complaints at this time.  Low suspicion for UTI or STI.  Patient given strict return precautions but at this time does not warrant a CT scan.  MDM Number of Diagnoses or Management Options Periumbilical abdominal pain: new, needed workup   Amount and/or Complexity of Data Reviewed Clinical lab tests: ordered and reviewed Decide to obtain previous medical records or to obtain history from someone other than the patient: yes Obtain history from someone other than the patient: no Review and summarize past medical records: yes Discuss the patient with other providers:  no  Risk of Complications, Morbidity, and/or Mortality Presenting problems: low Diagnostic procedures: minimal Management options: minimal  Patient Progress Patient progress: stable   Final Clinical Impression(s) / ED Diagnoses Final diagnoses:  Periumbilical abdominal pain    Rx / DC Orders ED Discharge Orders    None       Gwyneth Sprout, MD 09/30/19 1418

## 2019-09-30 NOTE — Discharge Instructions (Signed)
If you start having fever, vomiting or worsening pain you need to return to the emergency room for repeat check.

## 2019-09-30 NOTE — ED Triage Notes (Signed)
Pt c/o "tingling in my stomach" started last night-denies n/v/d and urinary sx-NAD-steady gait

## 2023-12-26 ENCOUNTER — Encounter (HOSPITAL_BASED_OUTPATIENT_CLINIC_OR_DEPARTMENT_OTHER): Payer: Self-pay | Admitting: Emergency Medicine

## 2023-12-26 ENCOUNTER — Emergency Department (HOSPITAL_BASED_OUTPATIENT_CLINIC_OR_DEPARTMENT_OTHER)
Admission: EM | Admit: 2023-12-26 | Discharge: 2023-12-26 | Disposition: A | Discharge status: Home / Self Care | Payer: Self-pay | Attending: Emergency Medicine | Admitting: Emergency Medicine

## 2023-12-26 ENCOUNTER — Other Ambulatory Visit: Payer: Self-pay

## 2023-12-26 DIAGNOSIS — S0006XA Insect bite (nonvenomous) of scalp, initial encounter: Secondary | ICD-10-CM | POA: Insufficient documentation

## 2023-12-26 DIAGNOSIS — W57XXXA Bitten or stung by nonvenomous insect and other nonvenomous arthropods, initial encounter: Secondary | ICD-10-CM | POA: Insufficient documentation

## 2023-12-26 MED ORDER — PREDNISONE 50 MG PO TABS
60.0000 mg | ORAL_TABLET | Freq: Once | ORAL | Status: AC
  Administered 2023-12-26: 60 mg via ORAL
  Filled 2023-12-26: qty 1

## 2023-12-26 MED ORDER — PREDNISONE 20 MG PO TABS: 40.0000 mg | ORAL_TABLET | Freq: Every day | ORAL | 0 refills | Status: AC

## 2023-12-26 NOTE — Discharge Instructions (Signed)
 Please read and follow all provided instructions.  Your diagnoses today include:  1. Insect bite of scalp, initial encounter     Tests performed today include: Vital signs. See below for your results today.   Medications prescribed:  Prednisone  - steroid medicine   It is best to take this medication in the morning to prevent sleeping problems. If you are diabetic, monitor your blood sugar closely and stop taking Prednisone  if blood sugar is over 300. Take with food to prevent stomach upset.   Take any prescribed medications only as directed.  Home care instructions:  Follow any educational materials contained in this packet Take an over-the-counter antihistamine as directed on packaging for the next 3 to 5 days  Follow-up instructions: Please follow-up with your primary care provider as needed for further evaluation of your symptoms.   Return instructions:  Please return to the Emergency Department if you experience worsening symptoms.  Call 9-1-1 immediately if you have an allergic reaction that involves your lips, mouth, throat or if you have any difficulty breathing. This is a life-threatening emergency.  Please return if you have any other emergent concerns.  Additional Information:  Your vital signs today were: BP 126/83   Pulse 73   Temp 98.5 F (36.9 C) (Oral)   Resp 16   Wt 72.6 kg   SpO2 98%   BMI 27.46 kg/m  If your blood pressure (BP) was elevated above 135/85 this visit, please have this repeated by your doctor within one month. --------------

## 2023-12-26 NOTE — ED Notes (Signed)
 Reviewed discharge instructions, follow up and medication with pt. Pt states understanding. Ambulatory at discharge No distress noted VS WNL

## 2023-12-26 NOTE — ED Provider Notes (Signed)
 [place] EMERGENCY DEPARTMENT AT [facility] [place] Provider Note   CSN: [id number] Arrival date & time: 12/26/23  1027     Patient presents with: Insect Bite   Donald Shaw is a 38 y.o. male.   Patient presents to the emergency department for evaluation of an insect sting.  Patient was sitting on a porch and felt a sting on his posterior scalp.  He subsequently had pain and swelling.  He developed some edema around his eyes.  No lip or tongue swelling.  No difficulty breathing or swallowing.  No lightheadedness or shortness of breath.  No vomiting or diarrhea.  He has never had a reaction like this before.  Denies other new exposures.  No fevers.       Prior to Admission medications   Medication Sig Start Date End Date Taking? Authorizing Provider  predniSONE  (DELTASONE ) 20 MG tablet Take 2 tablets (40 mg total) by mouth daily. 12/26/23  Yes Brigitt Mcclish, PA-C  cephALEXin  (KEFLEX ) 500 MG capsule Take 1 capsule (500 mg total) by mouth 3 (three) times daily. 02/25/16   Horton, Charmaine FALCON, MD    Allergies: Patient has no known allergies.    Review of Systems  Updated Vital Signs BP 126/83   Pulse 73   Temp 98.5 F (36.9 C) (Oral)   Resp 16   Wt 72.6 kg   SpO2 98%   BMI 27.46 kg/m   Physical Exam Vitals and nursing note reviewed.  Constitutional:      Appearance: He is well-developed.  HENT:     Head: Normocephalic.     Comments: Mild soft tissue swelling without induration or abscess to the right posterior occipital scalp.  No tenderness. Eyes:     Conjunctiva/sclera: Conjunctivae normal.     Comments: Minimal periorbital edema, globes appear normal.  Pulmonary:     Effort: No respiratory distress.  Musculoskeletal:     Cervical back: Normal range of motion and neck supple.  Skin:    General: Skin is warm and dry.     Comments: No hives noted on extremities.  Neurological:     Mental Status: He is alert.     (all labs ordered are listed, but only  abnormal results are displayed) Labs Reviewed - No data to display  EKG: None  Radiology: No results found.   Procedures   Medications Ordered in the ED  predniSONE  (DELTASONE ) tablet 60 mg (60 mg Oral Given 12/26/23 1115)   Patient seen and examined. History obtained directly from patient. Work-up including labs, imaging, EKG ordered in triage, if performed, were reviewed.    Labs/EKG: None ordered  Imaging: None ordered  Medications/Fluids: P.o. prednisone   Most recent vital signs reviewed and are as follows: BP 126/83   Pulse 73   Temp 98.5 F (36.9 C) (Oral)   Resp 16   Wt 72.6 kg   SpO2 98%   BMI 27.46 kg/m   Initial impression: Localized reaction to an insect sting, no anaphylaxis  Home treatment plan: Cool compresses, monitoring of symptoms, OTC antihistamines.  Return instructions discussed with patient: Return with worsening, difficulty breathing or swallowing, syncope  Follow-up instructions discussed with patient: PCP as needed                                  Medical Decision Making Risk Prescription drug management.   Patient with localized allergic reaction.  Will treat with several days  of prednisone  given that he had swelling around his eyes, away from the location of the sting.  Otherwise no symptoms consistent with anaphylaxis.  Overall symptoms are mild.  Encouraged use of over-the-counter medications, NSAIDs, antihistamines.     Final diagnoses:  Insect bite of scalp, initial encounter    ED Discharge Orders          Ordered    predniSONE  (DELTASONE ) 20 MG tablet  Daily        12/26/23 1133               Desiderio Chew, PA-C 12/26/23 1141    Dean Clarity, MD 12/26/23 1147

## 2023-12-26 NOTE — ED Triage Notes (Signed)
 Was sitting at his porche when he felt sting to occipital area, itching all over and bilateral eye irritation and swelling .  No resp distress.  Took 2 benadryl tablets prior to arrival .
